# Patient Record
Sex: Male | Born: 1990 | Race: White | Hispanic: No | Marital: Single | Smoking: Current every day smoker
Health system: Southern US, Community
[De-identification: ages and names within clinical notes are randomized; demographics above are authoritative.]

---
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
## 2011-12-05 ENCOUNTER — Emergency Department (HOSPITAL_COMMUNITY)
Admission: EM | Admit: 2011-12-05 | Discharge: 2011-12-05 | Disposition: A | Discharge status: Home / Self Care | Payer: Self-pay | Attending: Emergency Medicine | Admitting: Emergency Medicine

## 2011-12-05 ENCOUNTER — Encounter (HOSPITAL_COMMUNITY): Payer: Self-pay | Admitting: *Deleted

## 2011-12-05 DIAGNOSIS — S0500XA Injury of conjunctiva and corneal abrasion without foreign body, unspecified eye, initial encounter: Secondary | ICD-10-CM

## 2011-12-05 DIAGNOSIS — X58XXXA Exposure to other specified factors, initial encounter: Secondary | ICD-10-CM | POA: Insufficient documentation

## 2011-12-05 DIAGNOSIS — F17200 Nicotine dependence, unspecified, uncomplicated: Secondary | ICD-10-CM | POA: Insufficient documentation

## 2011-12-05 DIAGNOSIS — S058X9A Other injuries of unspecified eye and orbit, initial encounter: Secondary | ICD-10-CM | POA: Insufficient documentation

## 2011-12-05 MED ORDER — TETANUS-DIPHTH-ACELL PERTUSSIS 5-2.5-18.5 LF-MCG/0.5 IM SUSP
0.5000 mL | Freq: Once | INTRAMUSCULAR | Status: AC
  Administered 2011-12-05: 0.5 mL via INTRAMUSCULAR
  Filled 2011-12-05: qty 0.5

## 2011-12-05 MED ORDER — FLUORESCEIN SODIUM 1 MG OP STRP
1.0000 | ORAL_STRIP | Freq: Once | OPHTHALMIC | Status: AC
  Administered 2011-12-05: 1 via OPHTHALMIC
  Filled 2011-12-05: qty 1

## 2011-12-05 MED ORDER — TETRACAINE HCL 0.5 % OP SOLN: 2.0000 [drp] | Freq: Once | OPHTHALMIC | Status: DC

## 2011-12-05 MED ORDER — CIPROFLOXACIN HCL 0.3 % OP SOLN: 1.0000 [drp] | Freq: Four times a day (QID) | OPHTHALMIC | Status: AC

## 2011-12-05 MED ORDER — PROPARACAINE HCL 0.5 % OP SOLN
2.0000 [drp] | Freq: Once | OPHTHALMIC | Status: AC
  Administered 2011-12-05: 2 [drp] via OPHTHALMIC
  Filled 2011-12-05: qty 15

## 2011-12-05 MED ORDER — OXYCODONE-ACETAMINOPHEN 5-325 MG PO TABS: 1.0000 | ORAL_TABLET | ORAL | Status: AC | PRN

## 2011-12-05 NOTE — ED Notes (Signed)
Pt reports got ash from a cigarette in right eye yesterday. States swelling/pain/blurred vision today

## 2011-12-05 NOTE — ED Provider Notes (Signed)
History     CSN: [id number]  Arrival date & time 12/05/11  1340   First MD Initiated Contact with Patient 12/05/11 1427      Chief Complaint  Patient presents with  . Eye Injury    (Consider location/radiation/quality/duration/timing/severity/associated sxs/prior treatment) HPI Comments: Patient reports right eye pain that began around noon today while he was at work.  Pain is described as "burning."  Associated clear tearing.  Pt works "junking cars."  States he was under a car today but does not think anything fell in his eye.  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  Denies visual changes, pain with EOMs.    Patient is a 21 y.o. male presenting with eye injury. The history is provided by the patient.  Eye Injury Pertinent negatives include no chest pain, chills, congestion, coughing, fever or sore throat.    History reviewed. No pertinent past medical history.  History reviewed. No pertinent past surgical history.  No family history on file.  History  Substance Use Topics  . Smoking status: Current Everyday Smoker  . Smokeless tobacco: Not on file  . Alcohol Use: No      Review of Systems  Constitutional: Negative for fever and chills.  HENT: Negative for congestion, sore throat and sinus pressure.   Respiratory: Negative for cough and shortness of breath.   Cardiovascular: Negative for chest pain.    Allergies  Review of patient's allergies indicates no known allergies.  Home Medications   Current Outpatient Rx  Name Route Sig Dispense Refill  . TETRAHYDROZOLINE HCL 0.05 % OP SOLN Right Eye Place 3 drops into the right eye 2 (two) times daily.      BP 133/72  Pulse 79  Temp 98 F (36.7 C) (Oral)  Resp 16  SpO2 99%  Physical Exam  Nursing note and vitals reviewed. Constitutional: He appears well-developed and well-nourished. No distress.  HENT:  Head: Normocephalic and atraumatic.  Eyes: EOM  and lids are normal. Pupils are equal, round, and reactive to light. No foreign bodies found. Right conjunctiva is injected. Right conjunctiva has no hemorrhage. No scleral icterus. Right eye exhibits normal extraocular motion and no nystagmus. Left eye exhibits normal extraocular motion and no nystagmus. Pupils are equal.  Slit lamp exam:      The right eye shows corneal abrasion and fluorescein uptake.       Right eye approximately 11:00 corneal abrasion.    Neck: Neck supple.  Pulmonary/Chest: Effort normal.  Neurological: He is alert.  Skin: He is not diaphoretic.  Right eye 20/40, left eye 20/50.    ED Course  Procedures (including critical care time)  Labs Reviewed - No data to display No results found.   1. Corneal abrasion       MDM  Pt with corneal abrasion of right eye.  Pt does not wear contacts.  No e/o FB in eye.  Pt d/c home with cipro drops, percocet, ophthalmology follow up.  Tetanus updated.  Discussed diagnosis with patient and family.  Pt given return precautions.  Pt verbalizes understanding and agrees with plan.           [place], [place] 12/05/11 [phone]

## 2011-12-06 NOTE — ED Provider Notes (Signed)
Medical screening examination/treatment/procedure(s) were performed by non-physician practitioner and as supervising physician I was immediately available for consultation/collaboration.   Gerhard Munch, MD 12/06/11 [phone]

## 2014-04-14 ENCOUNTER — Emergency Department (HOSPITAL_COMMUNITY)
Admission: EM | Admit: 2014-04-14 | Discharge: 2014-04-14 | Disposition: A | Discharge status: Home / Self Care | Payer: Self-pay | Attending: Emergency Medicine | Admitting: Emergency Medicine

## 2014-04-14 ENCOUNTER — Encounter (HOSPITAL_COMMUNITY): Payer: Self-pay | Admitting: *Deleted

## 2014-04-14 ENCOUNTER — Emergency Department (HOSPITAL_COMMUNITY): Payer: Self-pay

## 2014-04-14 DIAGNOSIS — J069 Acute upper respiratory infection, unspecified: Secondary | ICD-10-CM | POA: Insufficient documentation

## 2014-04-14 DIAGNOSIS — R05 Cough: Secondary | ICD-10-CM

## 2014-04-14 DIAGNOSIS — Z720 Tobacco use: Secondary | ICD-10-CM | POA: Insufficient documentation

## 2014-04-14 DIAGNOSIS — J029 Acute pharyngitis, unspecified: Secondary | ICD-10-CM

## 2014-04-14 DIAGNOSIS — R059 Cough, unspecified: Secondary | ICD-10-CM

## 2014-04-14 DIAGNOSIS — Z79899 Other long term (current) drug therapy: Secondary | ICD-10-CM | POA: Insufficient documentation

## 2014-04-14 LAB — RAPID STREP SCREEN (MED CTR MEBANE ONLY): Streptococcus, Group A Screen (Direct): NEGATIVE

## 2014-04-14 MED ORDER — LIDOCAINE VISCOUS 2 % MT SOLN
20.0000 mL | Freq: Once | OROMUCOSAL | Status: AC
  Administered 2014-04-14: 20 mL via OROMUCOSAL
  Filled 2014-04-14: qty 30

## 2014-04-14 MED ORDER — LIDOCAINE VISCOUS 2 % MT SOLN: 15.0000 mL | OROMUCOSAL | Status: AC | PRN

## 2014-04-14 MED ORDER — ONDANSETRON 4 MG PO TBDP
4.0000 mg | ORAL_TABLET | Freq: Once | ORAL | Status: AC
  Administered 2014-04-14: 4 mg via ORAL
  Filled 2014-04-14: qty 1

## 2014-04-14 MED ORDER — METHYLPREDNISOLONE SODIUM SUCC 125 MG IJ SOLR
125.0000 mg | Freq: Once | INTRAMUSCULAR | Status: AC
  Administered 2014-04-14: 125 mg via INTRAMUSCULAR
  Filled 2014-04-14: qty 2

## 2014-04-14 NOTE — Discharge Instructions (Signed)
For pain control please take ibuprofen (also known as Motrin or Advil) 800mg  (this is normally 4 over the counter pills) 3 times a day  for 5 days. Take with food to minimize stomach irritation.  Do not hesitate to return to the emergency room for any new, worsening or concerning symptoms.  Please obtain primary care using resource guide below. But the minute you were seen in the emergency room and that they will need to obtain records for further outpatient management.   Pharyngitis Pharyngitis is a sore throat (pharynx). There is redness, pain, and swelling of your throat. HOME CARE   Drink enough fluids to keep your pee (urine) clear or pale yellow.  Only take medicine as told by your doctor.  You may get sick again if you do not take medicine as told. Finish your medicines, even if you start to feel better.  Do not take aspirin.  Rest.  Rinse your mouth (gargle) with salt water ( tsp of salt per 1 qt of water) every 1-2 hours. This will help the pain.  If you are not at risk for choking, you can suck on hard candy or sore throat lozenges. GET HELP IF:  You have large, tender lumps on your neck.  You have a rash.  You cough up green, yellow-brown, or bloody spit. GET HELP RIGHT AWAY IF:   You have a stiff neck.  You drool or cannot swallow liquids.  You throw up (vomit) or are not able to keep medicine or liquids down.  You have very bad pain that does not go away with medicine.  You have problems breathing (not from a stuffy nose). MAKE SURE YOU:   Understand these instructions.  Will watch your condition.  Will get help right away if you are not doing well or get worse. Document Released: 10/03/2007 Document Revised: 02/04/2013 Document Reviewed: 12/22/2012 [facility] Patient Information 2015 [place], [place]. This information is not intended to replace advice given to you by your health care provider. Make sure you discuss any questions you have with your health  care provider.  Emergency Department Resource Guide 1) Find a Doctor and Pay Out of Pocket Although you won't have to find out who is covered by your insurance plan, it is a good idea to ask around and get recommendations. You will then need to call the office and see if the doctor you have chosen will accept you as a new patient and what types of options they offer for patients who are self-pay. Some doctors offer discounts or will set up payment plans for their patients who do not have insurance, but you will need to ask so you aren't surprised when you get to your appointment.  2) Contact Your Local Health Department Not all health departments have doctors that can see patients for sick visits, but many do, so it is worth a call to see if yours does. If you don't know where your local health department is, you can check in your phone book. The CDC also has a tool to help you locate your state's health department, and many state websites also have listings of all of their local health departments.  3) Find a Walk-in Clinic If your illness is not likely to be very severe or complicated, you may want to try a walk in clinic. These are popping up all over the country in pharmacies, drugstores, and shopping centers. They're usually staffed by nurse practitioners or physician assistants that have been trained to  treat common illnesses and complaints. They're usually fairly quick and inexpensive. However, if you have serious medical issues or chronic medical problems, these are probably not your best option.  No Primary Care Doctor: - Call Health Connect at  [phone] - they can help you locate a primary care doctor that  accepts your insurance, provides certain services, etc. - Physician Referral Service- [phone]  Chronic Pain Problems: Organization         Address  Phone   Notes  [facility]  [phone] Patients need to be referred by their primary care doctor.    Medication Assistance: Organization         Address  Phone   Notes  [facility] Medication [facility] [address] [place], [place] [address] [phone]) [phone] --Must be a resident of [facility] -- Must have NO insurance coverage whatsoever (no Medicaid/ Medicare, etc.) -- The pt. MUST have a primary care doctor that directs their care regularly and follows them in the community   MedAssist  [phone]   [employer]  [phone]    Agencies that provide inexpensive medical care: Organization         Address  Phone   Notes  [facility]  [phone]   [facility]    [phone]   [facility] [address] [place], [place] [address] [phone]) [phone]   [facility] [address], [place] [phone]) [phone]   Planned Parenthood    [phone]   [facility]    [phone]) [phone]   [facility]  [address], [place] Phone:  [phone]) [phone], Fax:  [phone] Hours of Operation:  9 am - 6 pm, M-F.  Also accepts Medicaid/Medicare and self-pay.  [facility]  [address], [place] Phone: [phone], Fax: [phone]. Hours of Operation:  8:30 am - 5:30 pm, M-F.  Also accepts Medicaid and self-pay.  [facility] [place] [address], [place] Phone: [phone](336) [phone]   [facility] [address], [place], [place] [phone], Ext. [phone] Mondays & Thursdays: 7-9 AM.  First 15 patients are seen on a first come, first serve basis.    Medicaid-accepting [facility] Providers:  Organization         Address  Phone   Notes  [facility] [address], [place] [phone] Also accepts self-pay patients.  [facility] [address], [place]  [phone]   [facility] [address], [place] [phone]) [phone]   [facility] [address] [place] [address], [place] [phone]) [phone]   Renaye RakersVeita Bland [address], [place]   [phone] Only accepts [place] Access [place] patients after they have their name applied to their card.   Self-Pay (no insurance) in [facility] [place]:  Organization         Address  Phone   Notes  Sickle Cell Patients, [facility] [address], [place] [phone]) [phone]   [facility] [address], [place] [phone]   [facility] [place]  [address], [place] [facility] [phone]) [phone]   [facility]/Dr. Osei-Bonsu  [address] [place] [address], [place] or [phone], [place] [phone] Phone number for both [facility] and [place] locations is the same.  [facility] [address], [place] [phone]   [facility] [address], [place] or [address] [phone]   [facility] [address], [place] [phone], phone; [phone], fax Sees patients 1st and 3rd Saturday of every month.  Must not qualify for public or private insurance (i.e. Medicaid, Medicare, [place] Health Choice, Veterans' Benefits)  Household income should be no more than 200% of the poverty level The clinic cannot treat you if you are pregnant or think you are pregnant  Sexually transmitted diseases are not treated at the clinic.    Dental Care: Organization         Address  Phone  Notes  [facility] Department of [facility] [address] [place], [place] [phone] Accepts children up to age 45 who are enrolled in [place] or [place] Health Choice; pregnant women with a Medicaid card; and children who have applied for Medicaid or [place] Health Choice, but were declined, whose parents can pay a reduced fee at time of service.    [facility] Department of [facility]  [address], [place] [phone] Accepts children up to age 24 who are enrolled in [place] or [place] Health Choice; pregnant women with a Medicaid card; and children who have applied for Medicaid or [place] Health Choice, but were declined, whose parents can pay a reduced fee at time of service.  [facility]  [address] [place], [place] [phone] Patients are seen by appointment only. Walk-ins are not accepted. [facility] will see patients 75 years of age and older. Monday - Tuesday (8am-5pm) Most Wednesdays (8:30-5pm) $30 per visit, cash only  [facility]  [address], [facility] [phone] Patients are seen by appointment only. Walk-ins are not accepted. [facility] will see patients 65 years of age and older. One Wednesday Evening (Monthly: Volunteer Based).  $30 per visit, cash only  [employer]  [phone] for adults; Children under age 77, call Graduate Pediatric Dentistry at [phone]. Children aged 8-14, please call [phone] to request a pediatric application.  Dental services are provided in all areas of dental care including fillings, crowns and bridges, complete and partial dentures, implants, gum treatment, root canals, and extractions. Preventive care is also provided. Treatment is provided to both adults and children. Patients are selected via a lottery and there is often a waiting list.   [facility] [address] [place]  [phone] [email or web address]   [facility] [address] [place], [place] [phone], Ext. [phone] Second and Fourth Thursday of each month, opens at 6:30 AM; Clinic ends at 9 AM.  Patients are seen on a first-come first-served basis, and a limited number are seen during each clinic.   [facility]  [address] [place], [place] [phone]   Eligibility Requirements You must have lived in [place], [place], or [place] counties for at least the last three months.   You cannot be eligible for state or federal sponsored [place], including [employer], [place], or [employer].   You generally cannot be eligible for healthcare insurance through your employer.    How to apply: Eligibility screenings are held  every Tuesday and Wednesday afternoon from 1:00 pm until 4:00 pm. You do not need an appointment for the interview!  [facility] [address], [place], [place] [phone]   [facility]  [phone]   [facility]  [phone]   [facility]  [phone]    Behavioral Health Resources in the Community: Intensive Outpatient Programs Organization         Address  Phone  Notes  [facility] [address], [place], [place] [phone]   [facility] [address], [place], [place] [phone]   [facility] [address], [place], [place]  [phone]   [facility] [address],  [place], [place] [phone] or [phone]   Substance Abuse Resources Organization         Address  Phone  Notes  [facility] Drug Services  [phone]   [facility]  [phone]   [facility]  [phone]   [facility]  [phone]   Residential & Outpatient Substance Abuse Program  [phone]   Psychological Services Organization         Address  Phone  Notes  [facility]  [phone]- [phone]   [facility]  [phone]- [phone]   [facility] [address], [place] [phone] or [phone]    Mobile Crisis Teams Organization         Address  Phone  Notes  [facility], Mobile Crisis Care Unit  [phone]   [facility]  [address] [place], [place] [phone]   Doristine LocksSharon DeEsch [address] [place] [phone]    Self-Help/Support Groups Organization         Address  Phone             Notes  [facility]ental Health Assoc. of [place] - variety of support groups  336- [phone] Call for more information  Narcotics Anonymous (NA), [facility] [address], [place]  2 meetings at this location   Statisticianesidential Treatment Programs Organization         Address  Phone  Notes  [facility] [address],    [place]  [phone]   [facility]  [address], [place] [phone], [place], [place] [phone]   [facility] [address], [place] [phone] Admissions: 8am-3pm M-F  [facility] [address]    [place], [place] [phone]   [facility] [address], [place], [place] [phone]   [facility] [address]  [place], [place] [phone]   [facility] - Intensive Outpatient [address], [place], [place] [phone]   [facility] ([facility] [address]  [place], [place] [phone] or [phone]   [facility] ([facility]) [address] [place], [place] [phone] Accepts Medicaid  [facility] [address]  [place] [phone] Substance Abuse/Addiction Treatment   [facility] Resources Organization         Address  Phone  Notes  [facility]  [phone]   Angie FavaJulie Brannon, PhD [address], [address] [place], [place]   [phone](336) [phone] or [phone]   [facility]   [address] [place], [place] [phone](336) [phone]   [facility] [address], [place], [place] [phone]) [phone] Insurance/Medicaid/sponsorship through [facility]  [facility]ith [facility] Families [address]                                    [place], [place] [phone]  Therapy/tele-psych/case  [facility] [address]   [place], [place] [phone]    Dr. Lolly Mustache  [phone]   [facility] [place]  [employer] [facility] 1) [address], [place] 2) [address] [place] 3)  [address], [place] [phone]  [phone]   [facility] Child Abuse Hotline [phone] or [phone] (After Hours)

## 2014-04-14 NOTE — ED Notes (Signed)
[place], PT at bedside.

## 2014-04-14 NOTE — ED Notes (Signed)
Patient presents with cough and sore throat for 3 days.

## 2014-04-14 NOTE — ED Notes (Signed)
Pt with episode of emesis.

## 2014-04-14 NOTE — ED Provider Notes (Signed)
CSN: [id number]     Arrival date & time 04/14/14  2057 History  This chart was scribed for non-physician practitioner, Wynetta EmeryNicole Cris Talavera, PA-C, working with Tilden FossaElizabeth Rees, MD, by Bronson CurbJacqueline Melvin, ED Scribe. This patient was seen in room TR08C/TR08C and the patient's care was started at 9:35 PM.    Chief Complaint  Patient presents with  . Sore Throat  . Cough    The history is provided by the patient. No language interpreter was used.     HPI Comments: Richard Langlan W Stidd Jr. is a [age] y.o. male, with no significant medical history, who presents to the Emergency Department complaining of constant sore throat for the past 3 days. Patient rates his pain as 10/10. There is associated dry cough and "scratchy voice". Patient has taken liquid Tylenol this morning with only minimal improvement. He denies fever, chills, rhinorrhea, myalgias.    History reviewed. No pertinent past medical history. History reviewed. No pertinent past surgical history. No family history on file. History  Substance Use Topics  . Smoking status: Current Every Day Smoker  . Smokeless tobacco: Never Used  . Alcohol Use: Yes    Review of Systems  A complete 10 system review of systems was obtained and all systems are negative except as noted in the HPI and PMH.    Allergies  Review of patient's allergies indicates no known allergies.  Home Medications   Prior to Admission medications   Medication Sig Start Date End Date Taking? Authorizing Provider  tetrahydrozoline (REDNESS RELIEVER EYE DROPS) 0.05 % ophthalmic solution Place 3 drops into the right eye 2 (two) times daily.    Historical Provider, MD   Triage Vitals: BP 137/99 mmHg  Pulse 116  Temp(Src) 99.6 F (37.6 C) (Oral)  Resp 24  Ht 6\' 2"  (1.88 m)  SpO2 97%  Physical Exam  Constitutional: He is oriented to person, place, and time. He appears well-developed and well-nourished. No distress.  HENT:  Head: Normocephalic and atraumatic.  Mouth/Throat:  Oropharynx is clear and moist.  No drooling or stridor. Posterior pharynx mildly erythematous 1+ tonsillar hypertrophy. No exudate. Soft palate rises symmetrically. No TTP or induration under tongue.   No tenderness to palpation of frontal or bilateral maxillary sinuses.  No mucosal edema in the nares.  Bilateral tympanic membranes with normal architecture and good light reflex.    Eyes: Conjunctivae and EOM are normal. Pupils are equal, round, and reactive to light.  Neck: Normal range of motion. Neck supple. No tracheal deviation present.  Focal left anterior cervical lymph node with mobility and no tenderness to palpation.  Cardiovascular: Normal rate, regular rhythm and intact distal pulses.   Pulmonary/Chest: Effort normal and breath sounds normal. No stridor. No respiratory distress. He has no wheezes. He has no rales. He exhibits no tenderness.  Abdominal: Soft. Bowel sounds are normal. He exhibits no distension and no mass. There is no tenderness. There is no rebound and no guarding.  Musculoskeletal: Normal range of motion.  Lymphadenopathy:    He has cervical adenopathy.  Neurological: He is alert and oriented to person, place, and time.  Skin: Skin is warm and dry.  Psychiatric: He has a normal mood and affect. His behavior is normal.  Nursing note and vitals reviewed.   ED Course  Procedures (including critical care time)  DIAGNOSTIC STUDIES: Oxygen Saturation is 97% on room air, adequate by my interpretation.    COORDINATION OF CARE: At 2212 Discussed treatment plan with patient which includes steroids, Motrin,  Lidocaine, staying hydrated. Patient agrees.   Labs Review Labs Reviewed  RAPID STREP SCREEN  CULTURE, GROUP A STREP    Imaging Review Dg Chest 2 View  04/14/2014   CLINICAL DATA:  Mid chest pain, cough  EXAM: CHEST  2 VIEW  COMPARISON:  01/20/2007  FINDINGS: Lungs are clear.  No pleural effusion or pneumothorax.  The heart is normal in size.  Visualized  osseous structures are within normal limits.  IMPRESSION: Normal chest radiographs.   Electronically Signed   By: Charline BillsSriyesh  Krishnan M.D.   On: 04/14/2014 21:52     EKG Interpretation None      MDM   Final diagnoses:  Cough  Upper respiratory infection  Acute pharyngitis, unspecified pharyngitis type    Filed Vitals:   04/14/14 2100 04/14/14 2255  BP: 137/99 124/69  Pulse: 116 66  Temp: 99.6 F (37.6 C)   TempSrc: Oral   Resp: 24 18  Height: 6\' 2"  (1.88 m)   SpO2: 97% 100%    Medications  methylPREDNISolone sodium succinate (SOLU-MEDROL) 125 mg/2 mL injection 125 mg (not administered)  lidocaine (XYLOCAINE) 2 % viscous mouth solution 20 mL (not administered)    Richard LangAlan W Kimbley Jr. is a pleasant 23 y.o. male presenting with dry cough and sore throat for 3 days. Patient is well appearing, handling his secretions without issues. Physical exam is not consistent with a peritonsillar or retro-pharyngeal abscess. Rapid strep is negative, x-ray with no infiltrate. We'll give patient a shot of Solu-Medrol and viscous lidocaine.  Patient had an episode of emesis after in given the viscous lidocaine said the texture made him feel nauseous. ODT given and nausea has resolved. Patient's tachycardia resolved as well.  Evaluation does not show pathology that would require ongoing emergent intervention or inpatient treatment. Pt is hemodynamically stable and mentating appropriately. Discussed findings and plan with patient/guardian, who agrees with care plan. All questions answered. Return precautions discussed and outpatient follow up given.   New Prescriptions   LIDOCAINE (XYLOCAINE) 2 % SOLUTION    Use as directed 15 mLs in the mouth or throat every 3 (three) hours as needed.    I personally performed the services described in this documentation, which was scribed in my presence. The recorded information has been reviewed and is accurate.    Wynetta Emeryicole Ziah Turvey, PA-C 04/14/14  2328  Tilden FossaElizabeth Rees, MD 04/14/14 [phone]

## 2014-04-16 LAB — CULTURE, GROUP A STREP

## 2014-04-18 ENCOUNTER — Emergency Department (HOSPITAL_COMMUNITY)
Admission: EM | Admit: 2014-04-18 | Discharge: 2014-04-18 | Disposition: A | Discharge status: Home / Self Care | Payer: Self-pay | Attending: Emergency Medicine | Admitting: Emergency Medicine

## 2014-04-18 ENCOUNTER — Encounter (HOSPITAL_COMMUNITY): Payer: Self-pay | Admitting: *Deleted

## 2014-04-18 ENCOUNTER — Telehealth (HOSPITAL_COMMUNITY): Payer: Self-pay

## 2014-04-18 ENCOUNTER — Emergency Department (HOSPITAL_COMMUNITY): Payer: Self-pay

## 2014-04-18 DIAGNOSIS — J039 Acute tonsillitis, unspecified: Secondary | ICD-10-CM | POA: Insufficient documentation

## 2014-04-18 DIAGNOSIS — B2790 Infectious mononucleosis, unspecified without complication: Secondary | ICD-10-CM

## 2014-04-18 DIAGNOSIS — R000 Tachycardia, unspecified: Secondary | ICD-10-CM | POA: Insufficient documentation

## 2014-04-18 DIAGNOSIS — Z720 Tobacco use: Secondary | ICD-10-CM | POA: Insufficient documentation

## 2014-04-18 LAB — CBC WITH DIFFERENTIAL/PLATELET
BASOS ABS: 0.1 10*3/uL (ref 0.0–0.1)
BASOS PCT: 1 % (ref 0–1)
Eosinophils Absolute: 0 10*3/uL (ref 0.0–0.7)
Eosinophils Relative: 0 % (ref 0–5)
HEMATOCRIT: 45.6 % (ref 39.0–52.0)
HEMOGLOBIN: 15.6 g/dL (ref 13.0–17.0)
Lymphocytes Relative: 16 % (ref 12–46)
Lymphs Abs: 1.7 10*3/uL (ref 0.7–4.0)
MCH: 31.5 pg (ref 26.0–34.0)
MCHC: 34.2 g/dL (ref 30.0–36.0)
MCV: 91.9 fL (ref 78.0–100.0)
MONO ABS: 0.9 10*3/uL (ref 0.1–1.0)
MONOS PCT: 8 % (ref 3–12)
NEUTROS ABS: 8.2 10*3/uL — AB (ref 1.7–7.7)
Neutrophils Relative %: 75 % (ref 43–77)
Platelets: 222 10*3/uL (ref 150–400)
RBC: 4.96 MIL/uL (ref 4.22–5.81)
RDW: 12.8 % (ref 11.5–15.5)
WBC: 10.9 10*3/uL — ABNORMAL HIGH (ref 4.0–10.5)

## 2014-04-18 LAB — MONONUCLEOSIS SCREEN: Mono Screen: NEGATIVE

## 2014-04-18 LAB — RAPID STREP SCREEN (MED CTR MEBANE ONLY): Streptococcus, Group A Screen (Direct): NEGATIVE

## 2014-04-18 MED ORDER — IOHEXOL 300 MG/ML  SOLN
75.0000 mL | Freq: Once | INTRAMUSCULAR | Status: AC | PRN
  Administered 2014-04-18: 75 mL via INTRAVENOUS

## 2014-04-18 MED ORDER — OXYCODONE HCL 20 MG/ML PO CONC: 10.0000 mg | ORAL | Status: AC | PRN

## 2014-04-18 MED ORDER — PREDNISONE (PAK) 10 MG PO TABS: ORAL_TABLET | Freq: Every day | ORAL | Status: DC

## 2014-04-18 MED ORDER — MORPHINE SULFATE 4 MG/ML IJ SOLN
4.0000 mg | Freq: Once | INTRAMUSCULAR | Status: AC
  Administered 2014-04-18: 4 mg via INTRAVENOUS
  Filled 2014-04-18: qty 1

## 2014-04-18 MED ORDER — SODIUM CHLORIDE 0.9 % IV BOLUS (SEPSIS)
1000.0000 mL | Freq: Once | INTRAVENOUS | Status: AC
  Administered 2014-04-18: 1000 mL via INTRAVENOUS

## 2014-04-18 NOTE — Telephone Encounter (Signed)
Pharmacy calling regarding quantity of Oxycodone suspension. Medication comes in 30 ml container H. Muthersbaugh PA consulted ok to give 30 ml vs 15 ml originally ordered.  Pharmacist informed

## 2014-04-18 NOTE — Discharge Instructions (Signed)
Infectious Mononucleosis  Infectious mononucleosis (mono) is a common germ (viral) infection in children, teenagers, and young adults.   CAUSES   Mono is an infection caused by the Epstein Barr virus. The virus is spread by close personal contact with someone who has the infection. It can be passed by contact with your saliva through things such as kissing or sharing drinking glasses. Sometimes, the infection can be spread from someone who does not appear sick but still spreads the virus (asymptomatic carrier state).   SYMPTOMS   The most common symptoms of Mono are:  · Sore throat.  · Headache.  · Fatigue.  · Muscle aches.  · Swollen glands.  · Fever.  · Poor appetite.  · Enlarged liver or spleen.  The less common symptoms can include:  · Rash.  · Feeling sick to your stomach (nauseous).  · Abdominal pain.  DIAGNOSIS   Mono is diagnosed by a blood test.   TREATMENT   Treatment of mono is usually at home. There is no medicine that cures this virus. Sometimes hospital treatment is needed in severe cases. Steroid medicine sometimes is needed if the swelling in the throat causes breathing or swallowing problems.   HOME CARE INSTRUCTIONS   · Drink enough fluids to keep your urine clear or pale yellow.  · Eat soft foods. Cool foods like popsicles or ice cream can soothe a sore throat.  · Only take over-the-counter or prescription medicines for pain, discomfort, or fever as directed by your caregiver. Children under 18 years of age should not take aspirin.  · Gargle salt water. This may help relieve your sore throat. Put 1 teaspoon (tsp) of salt in 1 cup of warm water. Sucking on hard candy may also help.  · Rest as needed.  · Start regular activities gradually after the fever is gone. Be sure to rest when tired.  · Avoid strenuous exercise or contact sports until your caregiver says it is okay. The liver and spleen could be seriously injured.  · Avoid sharing drinking glasses or kissing until your caregiver tells you  that you are no longer contagious.  SEEK MEDICAL CARE IF:   · Your fever is not gone after 7 days.  · Your activity level is not back to normal after 2 weeks.  · You have yellow coloring to eyes and skin (jaundice).  SEEK IMMEDIATE MEDICAL CARE IF:   · You have severe pain in the abdomen or shoulder.  · You have trouble swallowing or drooling.  · You have trouble breathing.  · You develop a stiff neck.  · You develop a severe headache.  · You cannot stop throwing up (vomiting).  · You have convulsions.  · You are confused.  · You have trouble with balance.  · You develop signs of body fluid loss (dehydration):  ¨ Weakness.  ¨ Sunken eyes.  ¨ Pale skin.  ¨ Dry mouth.  ¨ Rapid breathing or pulse.  MAKE SURE YOU:   · Understand these instructions.  · Will watch your condition.  · Will get help right away if you are not doing well or get worse.  Document Released: 04/13/2000 Document Revised: 07/09/2011 Document Reviewed: 02/10/2008  ExitCare® Patient Information ©2015 [employer]. This information is not intended to replace advice given to you by your health care provider. Make sure you discuss any questions you have with your health care provider.

## 2014-04-18 NOTE — ED Provider Notes (Signed)
CSN: [id number]     Arrival date & time 04/18/14  1153 History   First MD Initiated Contact with Patient 04/18/14 1241     Chief Complaint  Patient presents with  . Sore Throat     (Consider location/radiation/quality/duration/timing/severity/associated sxs/prior Treatment) HPI Pt is a healthy 23yo with previous ED visit for sore throat who returns for worsening sore throat, subjective fever and hoarse voice. Mom and patient report he has had sore throat, congestion, cough for about 1 week. On Wednesday he came to the ED where a rapid strep was negative and he was diagnosed with a URI. He was given one dose of solumedrol and discharged with viscous lidocaine for symptom control. Since then his throat pain and fevers/chills have continued to worsen. His mom also reports his voice is worsened. He has not eaten anything in days because of pain but he has been drinking some despite pain with this as well. He reports his congestion and cough remain mild. No n/v/d. No chest or abdominal pain.    History reviewed. No pertinent past medical history. History reviewed. No pertinent past surgical history. History reviewed. No pertinent family history. History  Substance Use Topics  . Smoking status: Current Every Day Smoker  . Smokeless tobacco: Never Used  . Alcohol Use: Yes    Review of Systems See HPI   Allergies  Review of patient's allergies indicates no known allergies.  Home Medications   Prior to Admission medications   Medication Sig Start Date End Date Taking? Authorizing Provider  lidocaine (XYLOCAINE) 2 % solution Use as directed 15 mLs in the mouth or throat every 3 (three) hours as needed. 04/14/14   Nicole Pisciotta, PA-C   BP 148/85 mmHg  Pulse 115  Temp(Src) 98.1 F (36.7 C) (Oral)  Resp 18  SpO2 97% Physical Exam  Constitutional: He is oriented to person, place, and time. He appears well-developed and well-nourished.  Uncomfortable appearing  HENT:  Head:  Normocephalic and atraumatic.  Right Ear: Tympanic membrane is not injected and not erythematous. A middle ear effusion is present.  Left Ear: Tympanic membrane is not injected and not erythematous. A middle ear effusion is present.  Nose: Rhinorrhea present. No sinus tenderness.  Mouth/Throat: Uvula is midline and mucous membranes are normal. Oropharyngeal exudate, posterior oropharyngeal edema and posterior oropharyngeal erythema present.    Hot potato voice. Symmetric uvula and soft palate.  Eyes: Conjunctivae and EOM are normal. Pupils are equal, round, and reactive to light. Right eye exhibits no discharge. Left eye exhibits no discharge. No scleral icterus.  Neck: Normal range of motion. Neck supple. No tracheal deviation present.  Cardiovascular: Regular rhythm, normal heart sounds and intact distal pulses.   No murmur heard. tachycardic  Pulmonary/Chest: Effort normal and breath sounds normal. No respiratory distress. He has no wheezes.  Abdominal: Soft. Bowel sounds are normal. He exhibits no distension. There is no tenderness.  Lymphadenopathy:    He has cervical adenopathy.  Neurological: He is alert and oriented to person, place, and time.  Skin: Skin is warm and dry.  Psychiatric: He has a normal mood and affect. His behavior is normal.  Nursing note and vitals reviewed.   ED Course  Procedures (including critical care time) Labs Review Labs Reviewed  CBC WITH DIFFERENTIAL - Abnormal; Notable for the following:    WBC 10.9 (*)    All other components within normal limits  RAPID STREP SCREEN  MONONUCLEOSIS SCREEN    Imaging Review No results found.  EKG Interpretation None      MDM   Final diagnoses:  Tonsillitis with exudate   Recent URI dx with worsening throat pain and tonsillar exudates. Will repeat rapid strep and obtain CBC and monospot.  Strep and monospot negative. WBC 10.9. Concern for possible abscess given muffled voice so well get CT neck to  assess further.  CT negative. Atypical lymphs and lymphadenopathy concerning for mono despite negative monospot. Will give prednisone taper and liquid oxycodone for pain   Abram SanderElena M Adamo, MD 04/19/14 1722  Nelia Shiobert L Beaton, MD 04/26/14 [phone]

## 2014-04-18 NOTE — ED Notes (Signed)
Pt was seen here two days for sore throat, had negative strep but now has increase in pain, difficulty swallowing and fevers. Airway intact at triage.

## 2014-04-18 NOTE — ED Provider Notes (Signed)
Patient feeling better.  No evidence of retropharyngeal abscess or peritonsillar abscess.  Possible area of cellulitis in angle, left jaw.  Based on CT, although there is no evidence of cellulitis on physical exam.  Patient we discharged home.  According to Dr. Lytle MichaelsBien's instructions.  Return precautions given for new or worsening symptoms  Toy CookeyMegan Docherty, MD 04/18/14 1746

## 2014-04-18 NOTE — ED Provider Notes (Signed)
I saw and evaluated the patient, reviewed the resident's note and I agree with the findings and plan.   .Face to face Exam:  General:  Awake HEENT:  Atraumatic.  Bilateral tonsillar exudate.  No stridor.  Able to handle secretions.  Talking in full sentences Resp:  Normal effort Abd:  Nondistended.  No spleenomegaly. Neuro:No focal weakness Lymph: Posterior cervical adenopathy  In light of negative strep culture and atypical lymphocytes, most likely mono.Will treat with steroids, IV fluids, bed rest.  Instructed with return if worsening trouble breathing or swallowing.  Dr. Micheline Mazeocherty will check CT neck scan prior to discharge and fluids.  Nelia Shiobert L Yancarlos Berthold, MD 04/18/14 [phone]

## 2014-04-18 NOTE — ED Notes (Signed)
Pt returned from CT. Dr. Radford PaxBeaton at bedside.

## 2014-04-20 LAB — CULTURE, GROUP A STREP

## 2019-02-01 ENCOUNTER — Emergency Department (HOSPITAL_COMMUNITY)
Admission: EM | Admit: 2019-02-01 | Discharge: 2019-02-01 | Disposition: A | Discharge status: Home / Self Care | Payer: No Typology Code available for payment source | Attending: Emergency Medicine | Admitting: Emergency Medicine

## 2019-02-01 ENCOUNTER — Emergency Department (HOSPITAL_COMMUNITY): Payer: No Typology Code available for payment source

## 2019-02-01 ENCOUNTER — Encounter (HOSPITAL_COMMUNITY): Payer: Self-pay | Admitting: Emergency Medicine

## 2019-02-01 ENCOUNTER — Other Ambulatory Visit: Payer: Self-pay

## 2019-02-01 DIAGNOSIS — S80811A Abrasion, right lower leg, initial encounter: Secondary | ICD-10-CM | POA: Insufficient documentation

## 2019-02-01 DIAGNOSIS — Y929 Unspecified place or not applicable: Secondary | ICD-10-CM | POA: Insufficient documentation

## 2019-02-01 DIAGNOSIS — M25461 Effusion, right knee: Secondary | ICD-10-CM | POA: Diagnosis not present

## 2019-02-01 DIAGNOSIS — F17210 Nicotine dependence, cigarettes, uncomplicated: Secondary | ICD-10-CM | POA: Diagnosis not present

## 2019-02-01 DIAGNOSIS — M25561 Pain in right knee: Secondary | ICD-10-CM

## 2019-02-01 DIAGNOSIS — Y939 Activity, unspecified: Secondary | ICD-10-CM | POA: Diagnosis not present

## 2019-02-01 DIAGNOSIS — Y999 Unspecified external cause status: Secondary | ICD-10-CM | POA: Insufficient documentation

## 2019-02-01 NOTE — Discharge Instructions (Addendum)
Your xray was negative for any breaks today. You do have a small amount of fluid in your knee space.   Please wear knee immobilizer during the day and use crutches to help you get around. When at home please elevate your leg and ice it to reduce swelling.   You may take 600 mg Ibuprofen every 6-8 hours for pain as well as 1,000 mg Tylenol every 8 hours for pain.   Please follow up with orthopedist Dr. Stann Mainland
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
## 2020-11-11 ENCOUNTER — Emergency Department (HOSPITAL_COMMUNITY)
Admission: EM | Admit: 2020-11-11 | Discharge: 2020-11-11 | Disposition: A | Discharge status: Home / Self Care | Payer: Self-pay | Attending: Emergency Medicine | Admitting: Emergency Medicine

## 2020-11-11 ENCOUNTER — Emergency Department (HOSPITAL_COMMUNITY): Payer: Self-pay

## 2020-11-11 DIAGNOSIS — S41112A Laceration without foreign body of left upper arm, initial encounter: Secondary | ICD-10-CM

## 2020-11-11 DIAGNOSIS — F17200 Nicotine dependence, unspecified, uncomplicated: Secondary | ICD-10-CM | POA: Insufficient documentation

## 2020-11-11 DIAGNOSIS — W293XXA Contact with powered garden and outdoor hand tools and machinery, initial encounter: Secondary | ICD-10-CM | POA: Insufficient documentation

## 2020-11-11 DIAGNOSIS — Z23 Encounter for immunization: Secondary | ICD-10-CM | POA: Insufficient documentation

## 2020-11-11 DIAGNOSIS — S51812A Laceration without foreign body of left forearm, initial encounter: Secondary | ICD-10-CM | POA: Insufficient documentation

## 2020-11-11 MED ORDER — LIDOCAINE-EPINEPHRINE 1 %-1:100000 IJ SOLN
10.0000 mL | Freq: Once | INTRAMUSCULAR | Status: AC
  Administered 2020-11-11: 10 mL via INTRADERMAL
  Filled 2020-11-11: qty 1

## 2020-11-11 MED ORDER — TETANUS-DIPHTH-ACELL PERTUSSIS 5-2.5-18.5 LF-MCG/0.5 IM SUSY
0.5000 mL | PREFILLED_SYRINGE | Freq: Once | INTRAMUSCULAR | Status: AC
  Administered 2020-11-11: 0.5 mL via INTRAMUSCULAR
  Filled 2020-11-11: qty 0.5

## 2020-11-11 MED ORDER — CEPHALEXIN 500 MG PO CAPS: 500.0000 mg | ORAL_CAPSULE | Freq: Two times a day (BID) | ORAL | 0 refills | Status: AC

## 2020-11-11 MED ORDER — CEPHALEXIN 250 MG PO CAPS
500.0000 mg | ORAL_CAPSULE | Freq: Once | ORAL | Status: AC
  Administered 2020-11-11: 500 mg via ORAL
  Filled 2020-11-11: qty 2

## 2020-11-11 NOTE — ED Provider Notes (Signed)
[facility] EMERGENCY DEPARTMENT Provider Note   CSN: [id number] Arrival date & time: 11/11/20  1522     History Chief Complaint  Patient presents with   Extremity Laceration    Richard Dodson. is a 30 y.o. male present emergency department with a laceration to his left forearm.  The patient reports he was using a chainsaw earlier, and it slipped and cut into his left forearm.  Bleeding was controlled.  He is right-handed.  He does not recall his last tetanus shot.  He denies any fevers or chills or worsening rash.  He denies numbness or weakness in his fingers.  No other injuries reported.  HPI     No past medical history on file.  There are no problems to display for this patient.   No past surgical history on file.     No family history on file.  Social History   Tobacco Use   Smoking status: Every Day   Smokeless tobacco: Never  Substance Use Topics   Alcohol use: Yes   Drug use: No    Home Medications Prior to Admission medications   Medication Sig Start Date End Date Taking? Authorizing Provider  cephALEXin (KEFLEX) 500 MG capsule Take 1 capsule (500 mg total) by mouth 2 (two) times daily for 7 days. 11/11/20 11/18/20 Yes Jamiesha Victoria, Kermit Balo, MD  ibuprofen (ADVIL) 200 MG tablet Take 200 mg by mouth every 6 (six) hours as needed for mild pain or moderate pain.    [provider]  lidocaine (XYLOCAINE) 2 % solution Use as directed 15 mLs in the mouth or throat every 3 (three) hours as needed. Patient not taking: Reported on 02/01/2019 04/14/14   Pisciotta, Joni Reining, PA-C  OxyCODONE HCl 20 MG/ML CONC Take 10 mg by mouth every 4 (four) hours as needed (for throat pain). Patient not taking: Reported on 02/01/2019 04/18/14   Abram Sander, MD    Allergies    Patient has no known allergies.  Review of Systems   Review of Systems  Constitutional:  Negative for chills and fever.  Respiratory:  Negative for cough and shortness of breath.    Cardiovascular:  Negative for chest pain and palpitations.  Musculoskeletal:  Positive for myalgias. Negative for arthralgias and back pain.  Skin:  Positive for wound. Negative for rash.  Neurological:  Negative for weakness and numbness.   Physical Exam Updated Vital Signs BP 114/76 (BP Location: Right Arm)   Pulse 87   Temp 98.2 F (36.8 C) (Oral)   Resp 15   SpO2 100%   Physical Exam Constitutional:      General: He is not in acute distress. HENT:     Head: Normocephalic and atraumatic.  Eyes:     Conjunctiva/sclera: Conjunctivae normal.     Pupils: Pupils are equal, round, and reactive to light.  Cardiovascular:     Rate and Rhythm: Normal rate and regular rhythm.  Pulmonary:     Effort: Pulmonary effort is normal. No respiratory distress.  Musculoskeletal:     Comments: Approx 3 cm linear laceration overlying extensor surface of distal left forearm, no active bleeding, wound extends down to surface of muscle/fascia with small violation of fascia  Skin:    General: Skin is warm and dry.  Neurological:     General: No focal deficit present.     Mental Status: He is alert and oriented to person, place, and time. Mental status is at baseline.  Sensory: No sensory deficit.     Motor: No weakness.  Psychiatric:        Mood and Affect: Mood normal.        Behavior: Behavior normal.    ED Results / Procedures / Treatments   Labs (all labs ordered are listed, but only abnormal results are displayed) Labs Reviewed - No data to display  EKG None  Radiology DG Wrist Complete Left  Result Date: 11/11/2020 CLINICAL DATA:  Wrist trauma chainsaw injury EXAM: LEFT WRIST - COMPLETE 3+ VIEW COMPARISON:  None. FINDINGS: No fracture or malalignment. Laceration at the distal forearm on the dorsal radial side. No radiopaque foreign body IMPRESSION: No acute osseous abnormality Electronically Signed   By: Jasmine Pang M.D.   On: 11/11/2020 15:45    Procedures .Marland KitchenLaceration  Repair  Date/Time: 11/11/2020 4:57 PM Performed by: Terald Sleeper, MD Authorized by: Terald Sleeper, MD   Consent:    Consent obtained:  Verbal   Consent given by:  Patient   Risks, benefits, and alternatives were discussed: yes     Risks discussed:  Infection, pain, poor cosmetic result and poor wound healing Universal protocol:    Procedure explained and questions answered to patient or proxy's satisfaction: yes     Immediately prior to procedure, a time out was called: yes     Patient identity confirmed:  Arm band Anesthesia:    Anesthesia method:  Local infiltration   Local anesthetic:  Lidocaine 1% w/o epi Laceration details:    Location:  Shoulder/arm   Shoulder/arm location:  L lower arm   Length (cm):  3   Depth (mm):  3 Pre-procedure details:    Preparation:  Patient was prepped and draped in usual sterile fashion Exploration:    Limited defect created (wound extended): no     Hemostasis achieved with:  Direct pressure   Imaging outcome: foreign body not noted     Wound exploration: wound explored through full range of motion     Wound extent: areolar tissue violated and fascia violated     Wound extent: no foreign bodies/material noted, no muscle damage noted, no nerve damage noted, no tendon damage noted, no underlying fracture noted and no vascular damage noted     Contaminated: no   Treatment:    Area cleansed with:  Saline   Amount of cleaning:  Extensive   Irrigation solution:  Sterile saline   Debridement:  None   Undermining:  None   Scar revision: no   Skin repair:    Repair method:  Sutures   Suture size:  4-0   Suture material:  Prolene   Suture technique:  Simple interrupted   Number of sutures:  8 Approximation:    Approximation:  Close Repair type:    Repair type:  Simple Post-procedure details:    Dressing:  Non-adherent dressing   Procedure completion:  Tolerated well, no immediate complications   Medications Ordered in  ED Medications  Tdap (BOOSTRIX) injection 0.5 mL (0.5 mLs Intramuscular Given 11/11/20 1619)  cephALEXin (KEFLEX) capsule 500 mg (500 mg Oral Given 11/11/20 1619)  lidocaine-EPINEPHrine (XYLOCAINE W/EPI) 1 %-1:100000 (with pres) injection 10 mL (10 mLs Intradermal Given 11/11/20 1604)    ED Course  I have reviewed the triage vital signs and the nursing notes.  Pertinent labs & imaging results that were available during my care of the patient were reviewed by me and considered in my medical decision making (see chart for details).  30 yo male here with forearm laceration Will update tdap, start on keflex given nature of wound for infection ppx He is neurovascularly intact Doubt arterial injury  Laceration irrigated and repaired Xray reviewed - no foreign body noted  Okay for discharge, suture removal in 7-10 days.  Final Clinical Impression(s) / ED Diagnoses Final diagnoses:  Laceration of left upper extremity, initial encounter    Rx / DC Orders ED Discharge Orders          Ordered    cephALEXin (KEFLEX) 500 MG capsule  2 times daily        11/11/20 1601             Terald Sleeper, MD 11/11/20 1658

## 2020-11-11 NOTE — ED Triage Notes (Signed)
Pt presents w 2in lac on L forearm/wrist. Bleeding controlled on arrival, chainsaw slipped from hand when cutting tree  112/88 86 HR  A&O4

## 2020-11-11 NOTE — Discharge Instructions (Addendum)
You will need to have the stitches removed in 7 to 10 days.  It is very important that you do not keep them in longer than this, as this can lead to infection and other problems.  They will prevent infection, prescribed 5 days of antibiotics.  Please take the full course as prescribed.  You can take Tylenol and Motrin at home as needed for pain.  Keep your wound and stitches covered and dry for the next 2 days.  Afterwards you can shower normally and leave the wound open to air.  If you are working outside or in a dirty environment, wrap it with gauze until the stitches are removed.    You do not need to put bacitracin or other ointments on the wound.

## 2022-04-09 IMAGING — CR DG WRIST COMPLETE 3+V*L*
4 series · 4 of 4 positions shown · non-contrast
Comparison: None.

CLINICAL DATA: Wrist trauma chainsaw injury

EXAM:
LEFT WRIST - COMPLETE 3+ VIEW

[wrist pa]
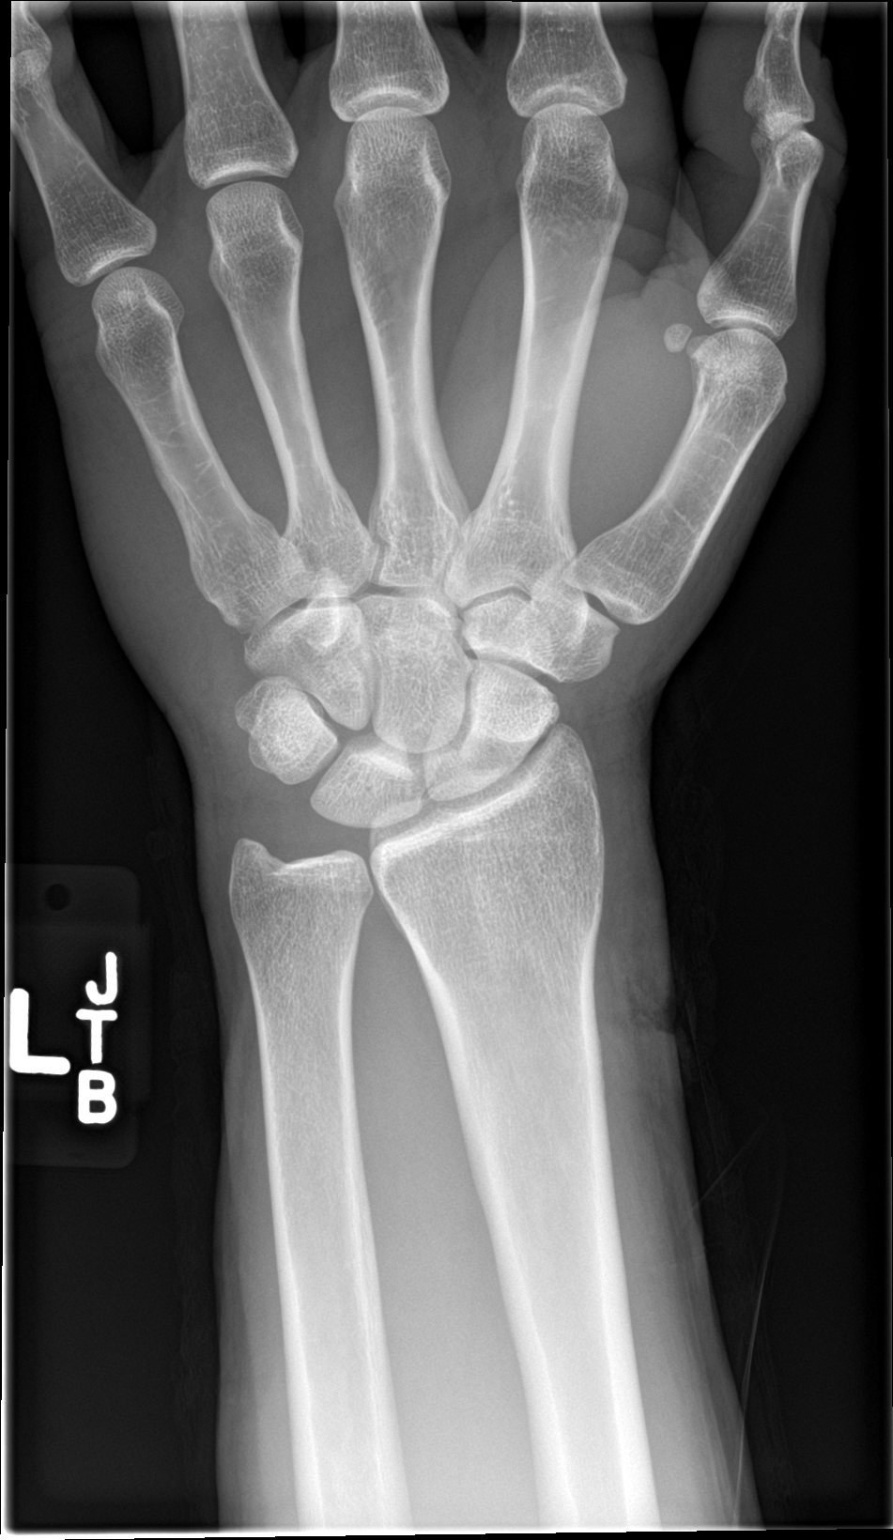

[wrist obl]
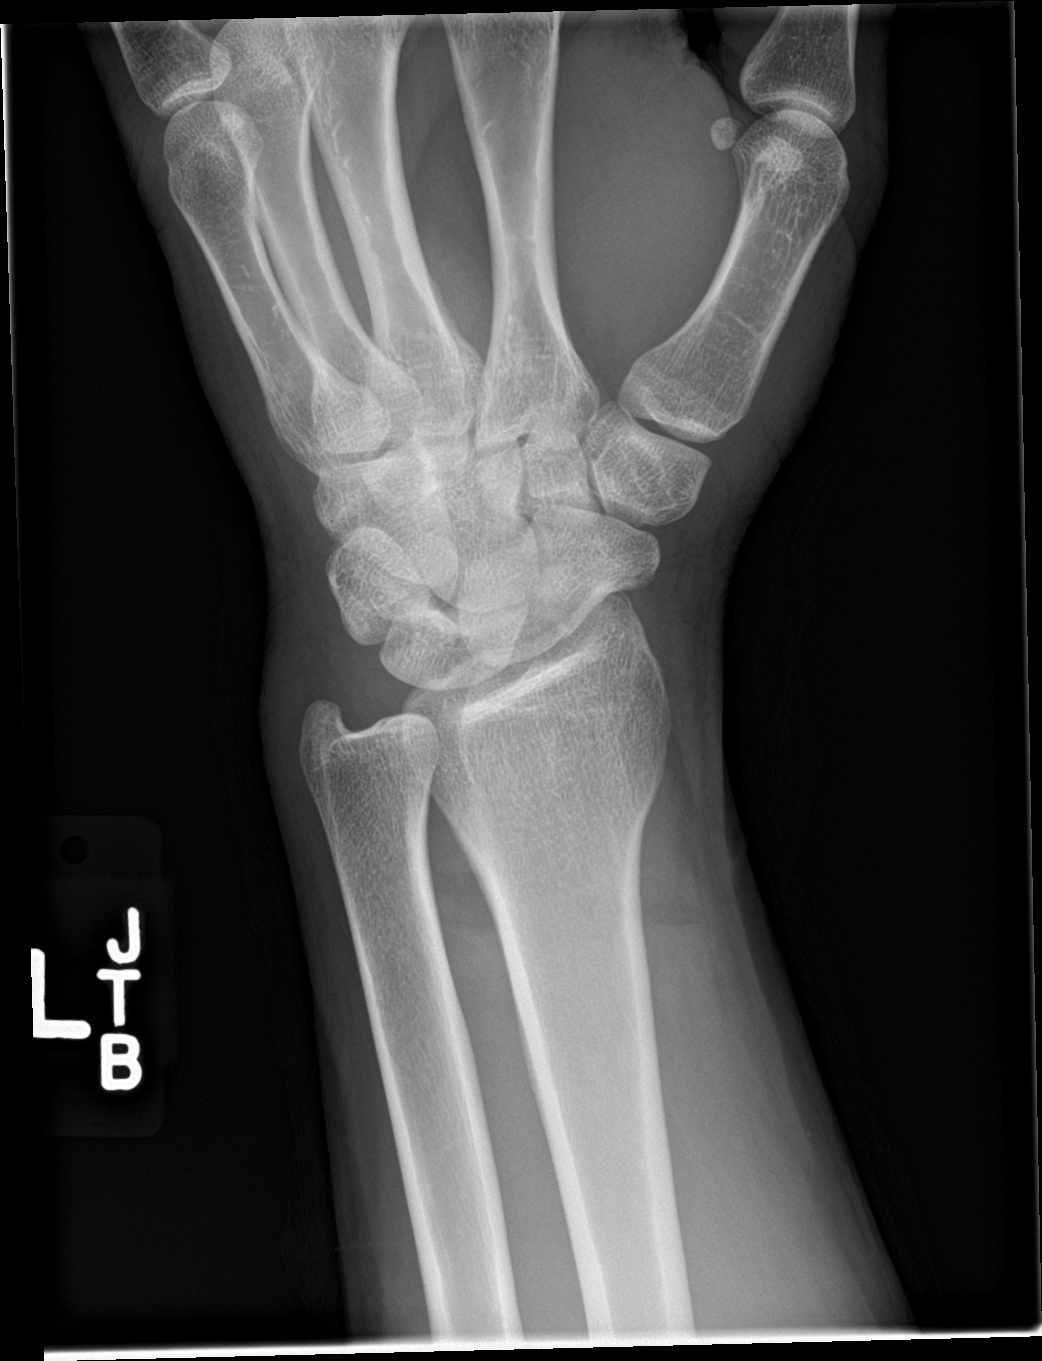

[wrist lat]
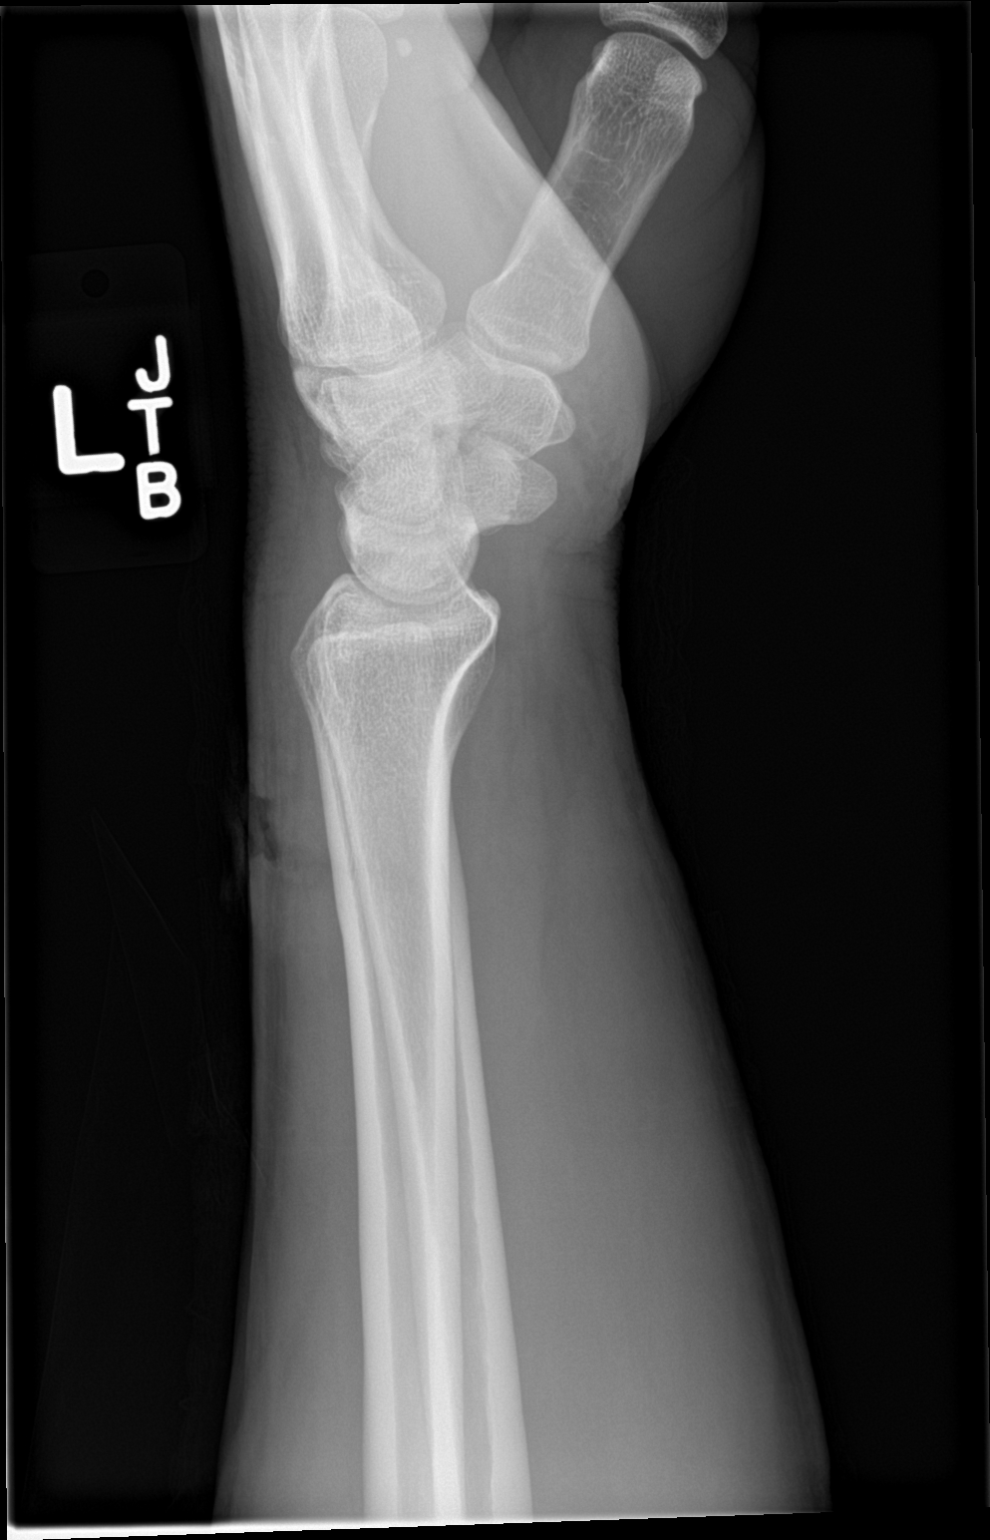

[wrist navicular]
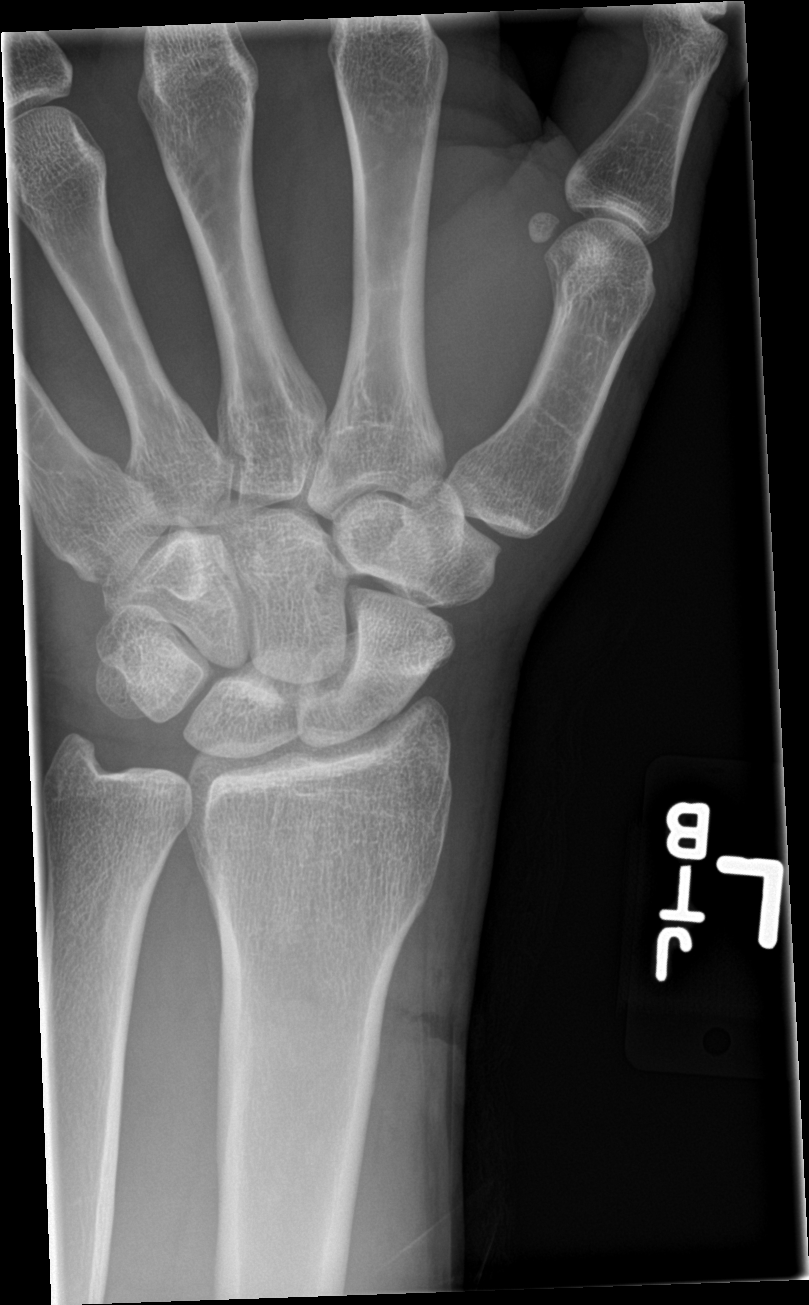

[4 of 4 positions shown; findings below may reference images not displayed]

FINDINGS: No fracture or malalignment. Laceration at the distal forearm on the
dorsal radial side. No radiopaque foreign body
IMPRESSION: No acute osseous abnormality

## 2023-06-22 ENCOUNTER — Encounter (HOSPITAL_COMMUNITY): Payer: Self-pay | Admitting: *Deleted

## 2023-06-22 ENCOUNTER — Ambulatory Visit (HOSPITAL_COMMUNITY)
Admission: EM | Admit: 2023-06-22 | Discharge: 2023-06-22 | Disposition: A | Discharge status: Home / Self Care | Payer: Self-pay | Attending: Physician Assistant | Admitting: Physician Assistant

## 2023-06-22 DIAGNOSIS — R112 Nausea with vomiting, unspecified: Secondary | ICD-10-CM

## 2023-06-22 MED ORDER — ONDANSETRON 4 MG PO TBDP: 4.0000 mg | ORAL_TABLET | Freq: Three times a day (TID) | ORAL | 0 refills | Status: AC | PRN

## 2023-06-22 NOTE — ED Triage Notes (Signed)
 Pt states he has been sick since eating out and he has been having vomiting since Monday. He is now having epigastric pain, diarrhea. He has taken tum.

## 2023-06-22 NOTE — ED Provider Notes (Signed)
 [facility]    CSN: [id number] Arrival date & time: 06/22/23  1003      History   Chief Complaint Chief Complaint  Patient presents with   Emesis   Diarrhea   Abdominal Pain    HPI Richard Litsey. is a 33 y.o. male.   HPI  He is here with companion who is assisting with HPI  They state he has been having vomiting since Monday night after eating at [facility] on Sunday  They also report he has had diarrhea but not since Monday   Patient and companion state that he has only been eating once per day  He states he tolerates liquid intake without issue  He does have a history of acid reflux  He has been taking TUMS for symptoms   He denies overt hematemesis or coffee ground emesis    History reviewed. No pertinent past medical history.  There are no active problems to display for this patient.   History reviewed. No pertinent surgical history.     Home Medications    Prior to Admission medications   Medication Sig Start Date End Date Taking? Authorizing Provider  ondansetron (ZOFRAN-ODT) 4 MG disintegrating tablet Take 1 tablet (4 mg total) by mouth every 8 (eight) hours as needed for nausea or vomiting. 06/22/23  Yes Daemien Fronczak E, PA-C  ibuprofen (ADVIL) 200 MG tablet Take 200 mg by mouth every 6 (six) hours as needed for mild pain or moderate pain.    [provider]  lidocaine (XYLOCAINE) 2 % solution Use as directed 15 mLs in the mouth or throat every 3 (three) hours as needed. Patient not taking: Reported on 02/01/2019 04/14/14   Pisciotta, Joni Reining, PA-C  OxyCODONE HCl 20 MG/ML CONC Take 10 mg by mouth every 4 (four) hours as needed (for throat pain). Patient not taking: Reported on 02/01/2019 04/18/14   Abram Sander, MD    Family History Family History  Problem Relation Age of Onset   Kidney disease Father    Mental illness Father     Social History Social History   Tobacco Use   Smoking status: Every Day    Types: Cigarettes    Smokeless tobacco: Never  Vaping Use   Vaping status: Never Used  Substance Use Topics   Alcohol use: Yes   Drug use: No     Allergies   Patient has no known allergies.   Review of Systems Review of Systems  Constitutional:  Positive for fatigue. Negative for chills and fever.  Respiratory:  Negative for cough, choking and shortness of breath.   Gastrointestinal:  Positive for abdominal pain, diarrhea, nausea and vomiting.     Physical Exam Triage Vital Signs ED Triage Vitals  Encounter Vitals Group     BP 06/22/23 1021 127/86     Systolic BP Percentile --      Diastolic BP Percentile --      Pulse Rate 06/22/23 1021 88     Resp 06/22/23 1021 18     Temp 06/22/23 1021 99.4 F (37.4 C)     Temp Source 06/22/23 1021 Oral     SpO2 06/22/23 1021 97 %     Weight --      Height --      Head Circumference --      Peak Flow --      Pain Score 06/22/23 1019 5     Pain Loc --      Pain  Education --      Exclude from [employer] Chart --    No data found.  Updated Vital Signs BP 127/86 (BP Location: Right Arm)   Pulse 88   Temp 99.4 F (37.4 C) (Oral)   Resp 18   SpO2 97%   Visual Acuity Right Eye Distance:   Left Eye Distance:   Bilateral Distance:    Right Eye Near:   Left Eye Near:    Bilateral Near:     Physical Exam Vitals reviewed.  Constitutional:      Appearance: He is well-developed.  HENT:     Head: Normocephalic and atraumatic.  Cardiovascular:     Rate and Rhythm: Normal rate and regular rhythm.     Heart sounds: Normal heart sounds. No murmur heard.    No friction rub. No gallop.  Pulmonary:     Effort: Pulmonary effort is normal.     Breath sounds: Normal breath sounds. No wheezing, rhonchi or rales.  Abdominal:     General: Abdomen is flat. Bowel sounds are normal.     Palpations: Abdomen is soft.     Tenderness: There is abdominal tenderness in the epigastric area.  Skin:    General: Skin is warm and dry.  Neurological:     General:  No focal deficit present.     Mental Status: He is alert and oriented to person, place, and time.  Psychiatric:        Mood and Affect: Mood normal.        Behavior: Behavior normal.      UC Treatments / Results  Labs (all labs ordered are listed, but only abnormal results are displayed) Labs Reviewed - No data to display  EKG   Radiology No results found.  Procedures Procedures (including critical care time)  Medications Ordered in UC Medications - No data to display  Initial Impression / Assessment and Plan / UC Course  I have reviewed the triage vital signs and the nursing notes.  Pertinent labs & imaging results that were available during my care of the patient were reviewed by me and considered in my medical decision making (see chart for details).      Final Clinical Impressions(s) / UC Diagnoses   Final diagnoses:  Nausea and vomiting, unspecified vomiting type   Acute, new concern Patient presents today with concerns for ongoing nausea and vomiting since Monday.  He reports some diarrhea on Monday but states that this is improved and resolved.  He reports he has a history of GERD and has been taking Tums to help relieve his symptoms.  He does report mild epigastric pressure and pain.  At this time I suspect he likely has gastritis and GERD flare.  Recommend starting Pepcid p.o. daily for at least 2 to 4 weeks to help calm down reflux symptoms.  Reviewed that he can use Tums or Pepto as needed for further acute flares during this time.  Will send in Zofran to assist with nausea and vomiting.  Reviewed that he should focus on hydration efforts with water, 0 sugar electrolyte replacement drinks such as 0 sugar Gatorade or Pedialyte.  Reviewed that for each electrolyte replacement drink he should drink at least 2 bottles of water to avoid over saturation.  Reviewed incorporating bland diet and slowly transitioning back to normal diet as tolerated.  Recommend follow-up if  symptoms are not Improving and reviewed ED and return precautions as needed for worsening symptoms.  Recommend emergency room follow-up if  he is unable to tolerate liquid intake and starts to feel dehydrated, has chest pain, shortness of breath, passing out.    Discharge Instructions      At this time I suspect that you likely have something called gastritis.  This is where the stomach becomes irritated.  This plus your previous history of acid reflux can create the pain that you are having in your stomach as well as the vomiting and nausea.  I have sent in a medication called Zofran to help with the nausea and vomiting.  You can take this as needed up to every 8 hours.  The Zofran can cause some constipation so please be mindful of this.  I do recommend starting a medication called Pepcid or Prilosec once per day for at least 2 to 4 weeks to help calm down your reflux.  During that time you can use Tums or Pepto for acute reflux flares.  I recommend focusing on increasing her hydration efforts.  Please make sure that you are drinking plenty of water and using sugar-free electrolyte replacement drinks such as Gatorade or Pedialyte.  If you are going to use electrolyte replacements please make sure that you drink at least 2 full bottles of water for each electrolyte drink.  If at any point you find that you are not able to tolerate liquid intake and start feel dehydrated, have trouble breathing, chest pain, worsening symptoms please go to the emergency room.     ED Prescriptions     Medication Sig Dispense Auth. Provider   ondansetron (ZOFRAN-ODT) 4 MG disintegrating tablet Take 1 tablet (4 mg total) by mouth every 8 (eight) hours as needed for nausea or vomiting. 20 tablet Keyen Marban E, PA-C      PDMP not reviewed this encounter.   Providence Crosby, PA-C 06/22/23 1103

## 2023-06-22 NOTE — Discharge Instructions (Addendum)
 At this time I suspect that you likely have something called gastritis.  This is where the stomach becomes irritated.  This plus your previous history of acid reflux can create the pain that you are having in your stomach as well as the vomiting and nausea.  I have sent in a medication called Zofran to help with the nausea and vomiting.  You can take this as needed up to every 8 hours.  The Zofran can cause some constipation so please be mindful of this.  I do recommend starting a medication called Pepcid or Prilosec once per day for at least 2 to 4 weeks to help calm down your reflux.  During that time you can use Tums or Pepto for acute reflux flares.  I recommend focusing on increasing her hydration efforts.  Please make sure that you are drinking plenty of water and using sugar-free electrolyte replacement drinks such as Gatorade or Pedialyte.  If you are going to use electrolyte replacements please make sure that you drink at least 2 full bottles of water for each electrolyte drink.  If at any point you find that you are not able to tolerate liquid intake and start feel dehydrated, have trouble breathing, chest pain, worsening symptoms please go to the emergency room.
